# Patient Record
Sex: Female | Born: 1990 | Race: Black or African American | Hispanic: No | Marital: Single
Health system: Southern US, Community
[De-identification: ages and names within clinical notes are randomized; demographics above are authoritative.]

---

## 2011-06-11 ENCOUNTER — Emergency Department: Payer: Self-pay | Admitting: Emergency Medicine

## 2013-07-22 ENCOUNTER — Emergency Department: Payer: Self-pay | Admitting: Emergency Medicine

## 2013-07-22 LAB — CBC
HCT: 36.4 % (ref 35.0–47.0)
HGB: 12 g/dL (ref 12.0–16.0)
MCH: 30.3 pg (ref 26.0–34.0)
MCHC: 33 g/dL (ref 32.0–36.0)
MCV: 92 fL (ref 80–100)
Platelet: 202 10*3/uL (ref 150–440)
RBC: 3.96 10*6/uL (ref 3.80–5.20)
RDW: 13.8 % (ref 11.5–14.5)
WBC: 6.2 10*3/uL (ref 3.6–11.0)

## 2013-07-22 LAB — COMPREHENSIVE METABOLIC PANEL
ALBUMIN: 3.4 g/dL (ref 3.4–5.0)
ALK PHOS: 43 U/L — AB
ALT: 14 U/L (ref 12–78)
Anion Gap: 6 — ABNORMAL LOW (ref 7–16)
BILIRUBIN TOTAL: 0.4 mg/dL (ref 0.2–1.0)
BUN: 9 mg/dL (ref 7–18)
CALCIUM: 9.2 mg/dL (ref 8.5–10.1)
Chloride: 105 mmol/L (ref 98–107)
Co2: 24 mmol/L (ref 21–32)
Creatinine: 0.85 mg/dL (ref 0.60–1.30)
EGFR (Non-African Amer.): >60
Glucose: 84 mg/dL (ref 65–99)
Osmolality: 268 (ref 275–301)
Potassium: 3.6 mmol/L (ref 3.5–5.1)
SGOT(AST): 11 U/L — ABNORMAL LOW (ref 15–37)
Sodium: 135 mmol/L — ABNORMAL LOW (ref 136–145)
Total Protein: 7.3 g/dL (ref 6.4–8.2)

## 2013-07-22 LAB — URINALYSIS, COMPLETE
BACTERIA: NONE SEEN
BILIRUBIN, UR: NEGATIVE
BLOOD: NEGATIVE
Glucose,UR: NEGATIVE mg/dL (ref 0–75)
LEUKOCYTE ESTERASE: NEGATIVE
NITRITE: NEGATIVE
PH: 7 (ref 4.5–8.0)
Protein: 30
Specific Gravity: 1.008 (ref 1.003–1.030)
Squamous Epithelial: NONE SEEN

## 2013-07-22 LAB — HCG, QUANTITATIVE, PREGNANCY: BETA HCG, QUANT.: 42289 m[IU]/mL — AB

## 2013-07-29 ENCOUNTER — Ambulatory Visit: Payer: Self-pay | Admitting: Family Medicine

## 2013-10-24 ENCOUNTER — Ambulatory Visit: Payer: Self-pay | Admitting: Family Medicine

## 2013-11-20 ENCOUNTER — Emergency Department: Payer: Self-pay | Admitting: Emergency Medicine

## 2013-11-20 LAB — BASIC METABOLIC PANEL
ANION GAP: 11 (ref 7–16)
BUN: 8 mg/dL (ref 7–18)
CALCIUM: 9.1 mg/dL (ref 8.5–10.1)
CO2: 20 mmol/L — AB (ref 21–32)
Chloride: 107 mmol/L (ref 98–107)
Creatinine: 0.8 mg/dL (ref 0.60–1.30)
EGFR (African American): >60
EGFR (Non-African Amer.): >60
GLUCOSE: 79 mg/dL (ref 65–99)
OSMOLALITY: 273 (ref 275–301)
Potassium: 3.8 mmol/L (ref 3.5–5.1)
Sodium: 138 mmol/L (ref 136–145)

## 2013-11-20 LAB — CBC WITH DIFFERENTIAL/PLATELET
BASOS PCT: 0.5 %
Basophil #: 0 10*3/uL (ref 0.0–0.1)
EOS ABS: 0.1 10*3/uL (ref 0.0–0.7)
Eosinophil %: 1.2 %
HCT: 35.1 % (ref 35.0–47.0)
HGB: 11.9 g/dL — AB (ref 12.0–16.0)
LYMPHS ABS: 1.3 10*3/uL (ref 1.0–3.6)
Lymphocyte %: 20 %
MCH: 31.4 pg (ref 26.0–34.0)
MCHC: 33.9 g/dL (ref 32.0–36.0)
MCV: 93 fL (ref 80–100)
Monocyte #: 0.8 x10 3/mm (ref 0.2–0.9)
Monocyte %: 13.1 %
NEUTROS ABS: 4.2 10*3/uL (ref 1.4–6.5)
Neutrophil %: 65.2 %
Platelet: 188 10*3/uL (ref 150–440)
RBC: 3.79 10*6/uL — ABNORMAL LOW (ref 3.80–5.20)
RDW: 13.7 % (ref 11.5–14.5)
WBC: 6.5 10*3/uL (ref 3.6–11.0)

## 2013-11-20 LAB — PRO B NATRIURETIC PEPTIDE: B-Type Natriuretic Peptide: 34 pg/mL (ref 0–125)

## 2013-11-20 LAB — TROPONIN I: Troponin-I: <0.02 ng/mL

## 2013-11-21 ENCOUNTER — Observation Stay: Payer: Self-pay | Admitting: Obstetrics and Gynecology

## 2013-11-21 LAB — T4, FREE: Free Thyroxine: 1.25 ng/dL (ref 0.76–1.46)

## 2013-11-21 LAB — TSH: THYROID STIMULATING HORM: 0.451 u[IU]/mL

## 2014-01-11 ENCOUNTER — Emergency Department: Payer: Self-pay | Admitting: Emergency Medicine

## 2014-01-11 LAB — BASIC METABOLIC PANEL
Anion Gap: 7 (ref 7–16)
BUN: 7 mg/dL (ref 7–18)
CREATININE: 0.78 mg/dL (ref 0.60–1.30)
Calcium, Total: 8.3 mg/dL — ABNORMAL LOW (ref 8.5–10.1)
Chloride: 106 mmol/L (ref 98–107)
Co2: 22 mmol/L (ref 21–32)
EGFR (African American): >60
GLUCOSE: 84 mg/dL (ref 65–99)
OSMOLALITY: 267 (ref 275–301)
POTASSIUM: 3.5 mmol/L (ref 3.5–5.1)
SODIUM: 135 mmol/L — AB (ref 136–145)

## 2014-01-11 LAB — CBC WITH DIFFERENTIAL/PLATELET
BASOS ABS: 0 10*3/uL (ref 0.0–0.1)
Basophil %: 0.4 %
Eosinophil #: 0.1 10*3/uL (ref 0.0–0.7)
Eosinophil %: 1.5 %
HCT: 34.3 % — AB (ref 35.0–47.0)
HGB: 11.2 g/dL — ABNORMAL LOW (ref 12.0–16.0)
LYMPHS ABS: 1.4 10*3/uL (ref 1.0–3.6)
Lymphocyte %: 23.1 %
MCH: 30.6 pg (ref 26.0–34.0)
MCHC: 32.8 g/dL (ref 32.0–36.0)
MCV: 94 fL (ref 80–100)
Monocyte #: 1 x10 3/mm — ABNORMAL HIGH (ref 0.2–0.9)
Monocyte %: 16.5 %
NEUTROS PCT: 58.5 %
Neutrophil #: 3.6 10*3/uL (ref 1.4–6.5)
Platelet: 153 10*3/uL (ref 150–440)
RBC: 3.66 10*6/uL — ABNORMAL LOW (ref 3.80–5.20)
RDW: 13.6 % (ref 11.5–14.5)
WBC: 6.2 10*3/uL (ref 3.6–11.0)

## 2014-01-12 ENCOUNTER — Encounter: Payer: Self-pay | Admitting: Maternal & Fetal Medicine

## 2014-01-17 ENCOUNTER — Ambulatory Visit: Payer: Self-pay | Admitting: Obstetrics and Gynecology

## 2014-02-20 ENCOUNTER — Emergency Department: Payer: Self-pay | Admitting: Emergency Medicine

## 2014-02-20 LAB — BASIC METABOLIC PANEL
Anion Gap: 10 (ref 7–16)
BUN: 10 mg/dL (ref 7–18)
CALCIUM: 8.5 mg/dL (ref 8.5–10.1)
CHLORIDE: 104 mmol/L (ref 98–107)
CO2: 25 mmol/L (ref 21–32)
CREATININE: 0.73 mg/dL (ref 0.60–1.30)
EGFR (African American): >60
Glucose: 77 mg/dL (ref 65–99)
Osmolality: 275 (ref 275–301)
Potassium: 3.8 mmol/L (ref 3.5–5.1)
Sodium: 139 mmol/L (ref 136–145)

## 2014-02-20 LAB — CBC
HCT: 36.1 % (ref 35.0–47.0)
HGB: 11.9 g/dL — ABNORMAL LOW (ref 12.0–16.0)
MCH: 31.2 pg (ref 26.0–34.0)
MCHC: 32.9 g/dL (ref 32.0–36.0)
MCV: 95 fL (ref 80–100)
Platelet: 146 10*3/uL — ABNORMAL LOW (ref 150–440)
RBC: 3.81 10*6/uL (ref 3.80–5.20)
RDW: 14.1 % (ref 11.5–14.5)
WBC: 8.5 10*3/uL (ref 3.6–11.0)

## 2014-02-20 LAB — PRO B NATRIURETIC PEPTIDE: B-TYPE NATIURETIC PEPTID: 29 pg/mL (ref 0–125)

## 2014-02-25 ENCOUNTER — Observation Stay: Payer: Self-pay | Admitting: Obstetrics and Gynecology

## 2014-02-26 ENCOUNTER — Inpatient Hospital Stay: Payer: Self-pay | Admitting: Certified Nurse Midwife

## 2014-02-26 LAB — CBC WITH DIFFERENTIAL/PLATELET
Basophil #: 0.1 10*3/uL (ref 0.0–0.1)
Basophil %: 0.6 %
EOS PCT: 0 %
Eosinophil #: 0 10*3/uL (ref 0.0–0.7)
HCT: 40.2 % (ref 35.0–47.0)
HGB: 13.6 g/dL (ref 12.0–16.0)
Lymphocyte #: 1.1 10*3/uL (ref 1.0–3.6)
Lymphocyte %: 6.6 %
MCH: 31.1 pg (ref 26.0–34.0)
MCHC: 33.9 g/dL (ref 32.0–36.0)
MCV: 92 fL (ref 80–100)
MONOS PCT: 7.6 %
Monocyte #: 1.2 x10 3/mm — ABNORMAL HIGH (ref 0.2–0.9)
NEUTROS ABS: 13.8 10*3/uL — AB (ref 1.4–6.5)
NEUTROS PCT: 85.2 %
Platelet: 223 10*3/uL (ref 150–440)
RBC: 4.38 10*6/uL (ref 3.80–5.20)
RDW: 14.4 % (ref 11.5–14.5)
WBC: 16.1 10*3/uL — AB (ref 3.6–11.0)

## 2014-02-26 LAB — GC/CHLAMYDIA PROBE AMP

## 2014-02-28 LAB — CBC WITH DIFFERENTIAL/PLATELET
Basophil #: 0 10*3/uL (ref 0.0–0.1)
Basophil %: 0.4 %
EOS PCT: 1.2 %
Eosinophil #: 0.1 10*3/uL (ref 0.0–0.7)
HCT: 36.2 % (ref 35.0–47.0)
HGB: 11.7 g/dL — ABNORMAL LOW (ref 12.0–16.0)
LYMPHS PCT: 33.5 %
Lymphocyte #: 2.5 10*3/uL (ref 1.0–3.6)
MCH: 30.8 pg (ref 26.0–34.0)
MCHC: 32.4 g/dL (ref 32.0–36.0)
MCV: 95 fL (ref 80–100)
MONOS PCT: 10.7 %
Monocyte #: 0.8 x10 3/mm (ref 0.2–0.9)
Neutrophil #: 4 10*3/uL (ref 1.4–6.5)
Neutrophil %: 54.2 %
Platelet: 172 10*3/uL (ref 150–440)
RBC: 3.8 10*6/uL (ref 3.80–5.20)
RDW: 14.4 % (ref 11.5–14.5)
WBC: 7.4 10*3/uL (ref 3.6–11.0)

## 2014-08-05 NOTE — Consult Note (Signed)
PATIENT NAME:  Tracey Nelson, PAREKH MR#:  [id number] DATE OF BIRTH:  Jul 21, 1990  DATE OF CONSULTATION:  11/21/2013  REFERRING PHYSICIAN:   CONSULTING PHYSICIAN:  Herschell Dimes. Renae Gloss, MD  REASON FOR CONSULTATION: Pneumonia.   HISTORY OF PRESENT ILLNESS: This is a 24 year old female who was in the ER yesterday, diagnosed with pneumonia. Was given a prescription for a Z-Pak, but did not pick up the antibiotic. Went to the gynecologist today and was referred in for hospitalization secondary to pneumonia. The patient has been having hot and cold chills, sore throat, equilibrium has been off. She has been coughing nonstop for 8 days, more coughing and production of yellow phlegm in the morning. She has been short of breath. She actually vomits when she has coughing fits. She is currently hemodynamically stable and afebrile, and hospitalist services were contacted for consultation.   PAST MEDICAL HISTORY: Asthma, thyroid issue, anxiety and depression.   PAST SURGICAL HISTORY: Ganglion cyst.  ALLERGIES: No known drug allergies.   MEDICATIONS: The patient stopped her prenatal vitamins and stopped her Zoloft, now not taking any medications at home.   SOCIAL HISTORY: Quit smoking with the pregnancy. No alcohol. No drug use.   FAMILY HISTORY: Mother has ovarian cancer, asthma and heart issues. Father, unknown medical history.   REVIEW OF SYSTEMS:  CONSTITUTIONAL: Positive for hot and cold feeling, chills. Positive for weight loss with early pregnancy. Positive for fatigue.  EYES: No blurry vision.  ENT: Positive for sore throat.  CARDIOVASCULAR: Positive for chest pain, rib pain, and back pain.  RESPIRATORY: Positive for shortness of breath, coughing nonstop for 8 days in the a.m., brings up yellow phlegm.  GASTROINTESTINAL: Positive for vomiting. Positive for diarrhea, slight blood. No abdominal pain.  GENITOURINARY: Positive for dark urine and blood in the urine.  MUSCULOSKELETAL: Positive for neck  pain, back pain and rib pain.  NEUROLOGIC: Equilibrium feels a little bit off. Has been having some falls at home.  PSYCHIATRIC: Positive for anxiety and depression.  ENDOCRINE: Thyroid issue in the past. States that she was going to have a biopsy of the thyroid, but then lost insurance.  HEMATOLOGIC AND LYMPHATIC: No anemia.   PHYSICAL EXAMINATION:  VITAL SIGNS: Temperature 97.8, pulse 77, respirations 18, blood pressure 104/64, pulse oximetry 96% on room air.  GENERAL: No respiratory distress.  EYES: Conjunctivae and lids normal. Pupils equal, round, and reactive to light. Extraocular muscles intact. No nystagmus.  ENT: Tympanic membrane on the right bulging and erythematous. Tympanic membrane on the left bulging. No erythema. Nasal mucosa, no erythema. Throat, slight erythema. No exudate seen. Lips and gums, no lesions.  NECK: No JVD. No bruits. No lymphadenopathy, no thyromegaly palpated.  LUNGS: Clear to auscultation. No use of accessory muscles to breathe. No rhonchi, rales, or wheeze heard.  CARDIOVASCULAR: S1, S2 normal. No gallops, rubs, or murmurs heard. Carotid upstroke 2+ bilaterally. No bruits.  EXTREMITIES: Dorsalis pedis pulses 1+ bilaterally, 2+ edema bilateral lower extremities.  ABDOMEN: Soft, nontender. No organomegaly or splenomegaly. Normoactive bowel sounds. No masses felt.  LYMPHATIC: No lymph nodes in the neck.  MUSCULOSKELETAL: No clubbing, 2+ edema, no cyanosis.  SKIN: No ulcers or lesions seen anteriorly.  NEUROLOGIC: Cranial nerves II through XII grossly intact. Deep tendon reflexes 1+ bilateral lower extremities.  PSYCHIATRIC: The patient is oriented to person, place, and time.   LABORATORY AND RADIOLOGICAL DATA: Troponin negative. White blood cell count 6.5, hemoglobin and hematocrit 11.9 and 35.1, platelet count 188,000. BNP 34, glucose 79, BUN  8, creatinine 0.8, sodium 138, potassium 3.8, chloride 107, CO2 of 20, calcium 9.1.   Chest x-ray showed lower lobe  airspace disease, likely representing pneumonia in the right lower lung.   ASSESSMENT AND PLAN:  1. Pneumonia in a pregnant patient. Rocephin and Zithromax ordered IV while here. Can switch over to Ceftin 500 mg p.o. b.i.d. for a total of 10 days. I wrote the prescription for 9 days, assuming she will get the Rocephin here today and Zithromax can be switched over to 250 mg p.o. daily for a total of 5 days upon discharge. I ordered for a care manager to consult to help out with medications. The patient does have the Zithromax prescription already at the pharmacy. With the patient's normal vital signs, she should be able to go home soon.  2. Obesity, no weight recorded yet.  3. History of depression, anxiety. Gynecologist wrote for Zoloft. The patient stopped taking secondary to nausea.  4. History of asthma. No signs of asthma exacerbation at this point.  5. History of thyroid problems in the past. We will check a TSH and free T4.   TIME SPENT ON CONSULTATION: 50 minutes.    ____________________________ Herschell Dimesichard J. Renae GlossWieting, MD rjw:jr D: 11/21/2013 13:37:05 ET T: 11/21/2013 14:37:41 ET JOB#: [id number]  cc: Herschell Dimesichard J. Renae GlossWieting, MD, <Dictator> Salley ScarletICHARD J Abdullah Rizzi MD ELECTRONICALLY SIGNED 11/22/2013 19:57
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
## 2015-02-03 IMAGING — CR DG CHEST 1V PORT
1 series · 1 of 1 positions shown · non-contrast
Comparison: None.

CLINICAL DATA: 23-year-old female with shortness of breath.

EXAM:
PORTABLE CHEST - 1 VIEW

[ap]
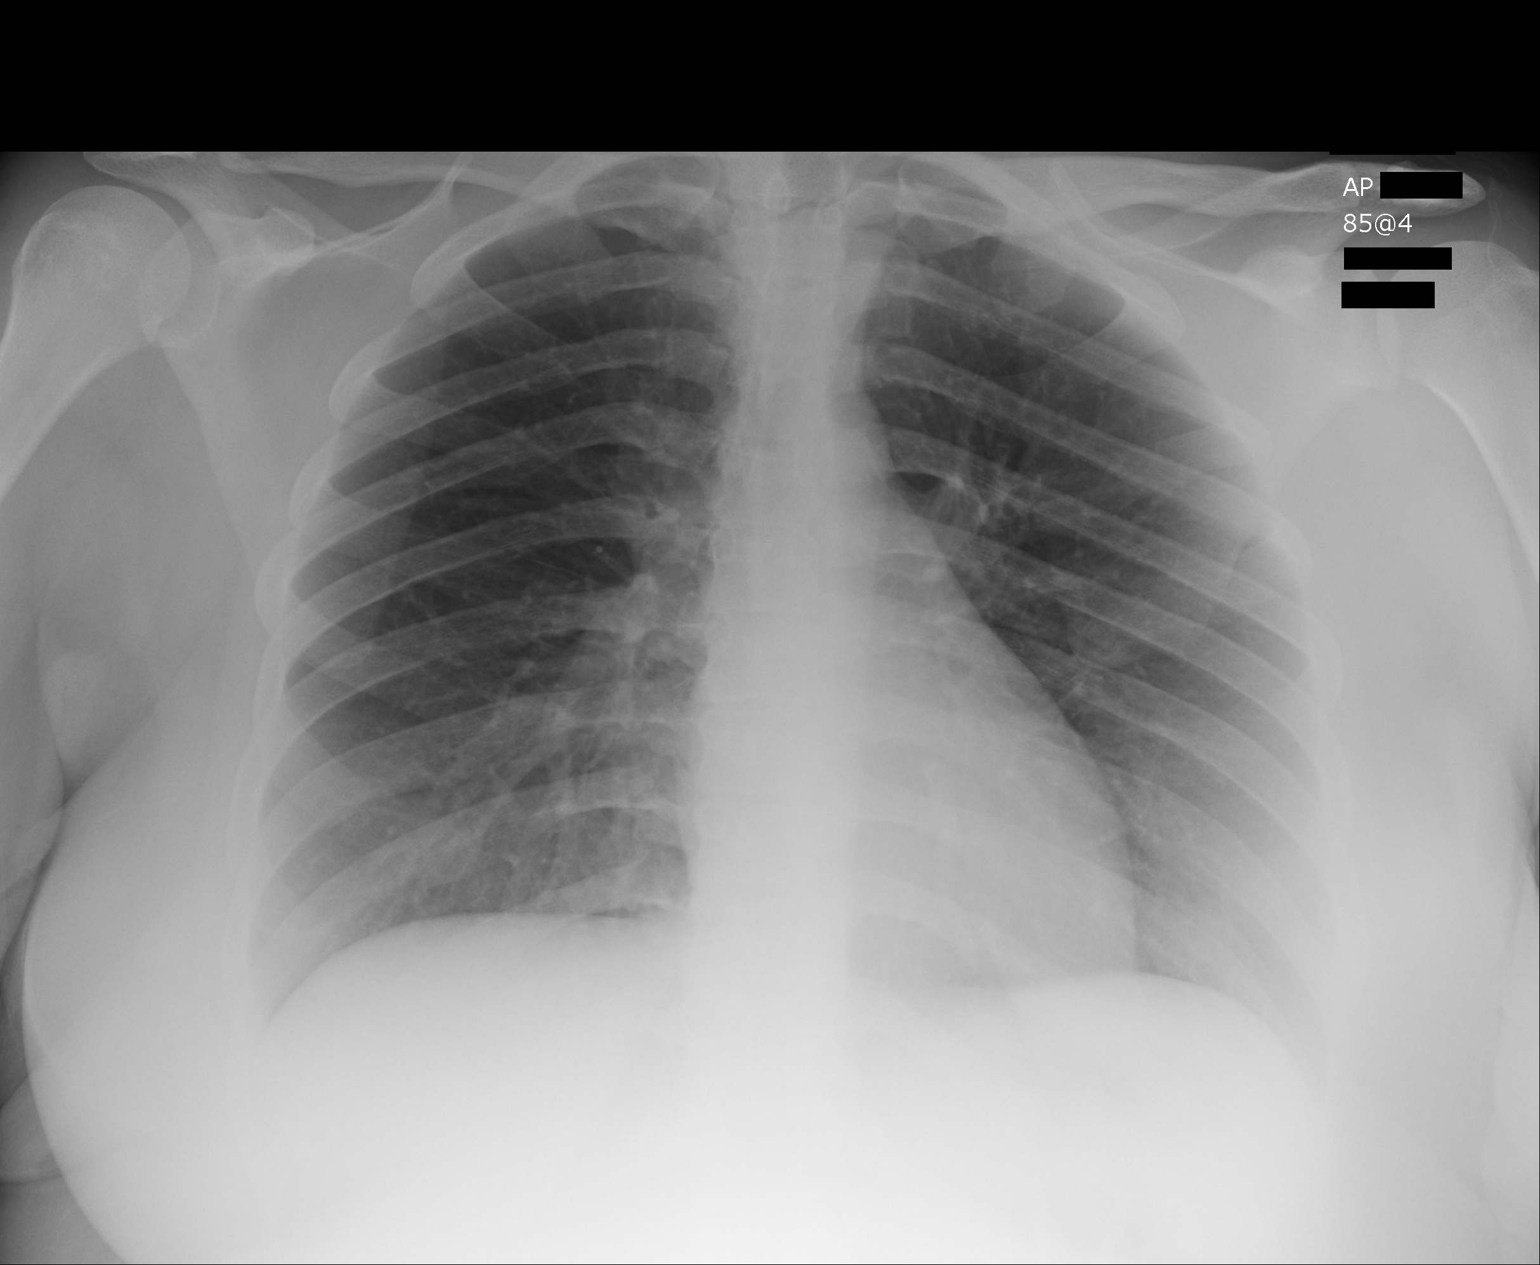

[1 of 1 positions shown; findings below may reference images not displayed]

FINDINGS: The cardiomediastinal silhouette is unremarkable.

Mild increased density overlying the medial right lower lung may
represent atelectasis or airspace disease.

There is no evidence of pulmonary edema, suspicious pulmonary
nodule/mass, pleural effusion, or pneumothorax. No acute bony
abnormalities are identified.
IMPRESSION: Question medial right lower lung opacity which may represent
atelectasis or airspace disease/pneumonia. Consider lateral chest
radiograph or radiographic followup.

## 2015-02-03 IMAGING — CR DG CHEST 1V
1 series · 1 of 1 positions shown · non-contrast
Comparison: 11/20/2013 frontal chest radiograph

CLINICAL DATA: 23-year-old female with shortness of breath.

EXAM:
CHEST - 1 VIEW

[lat]
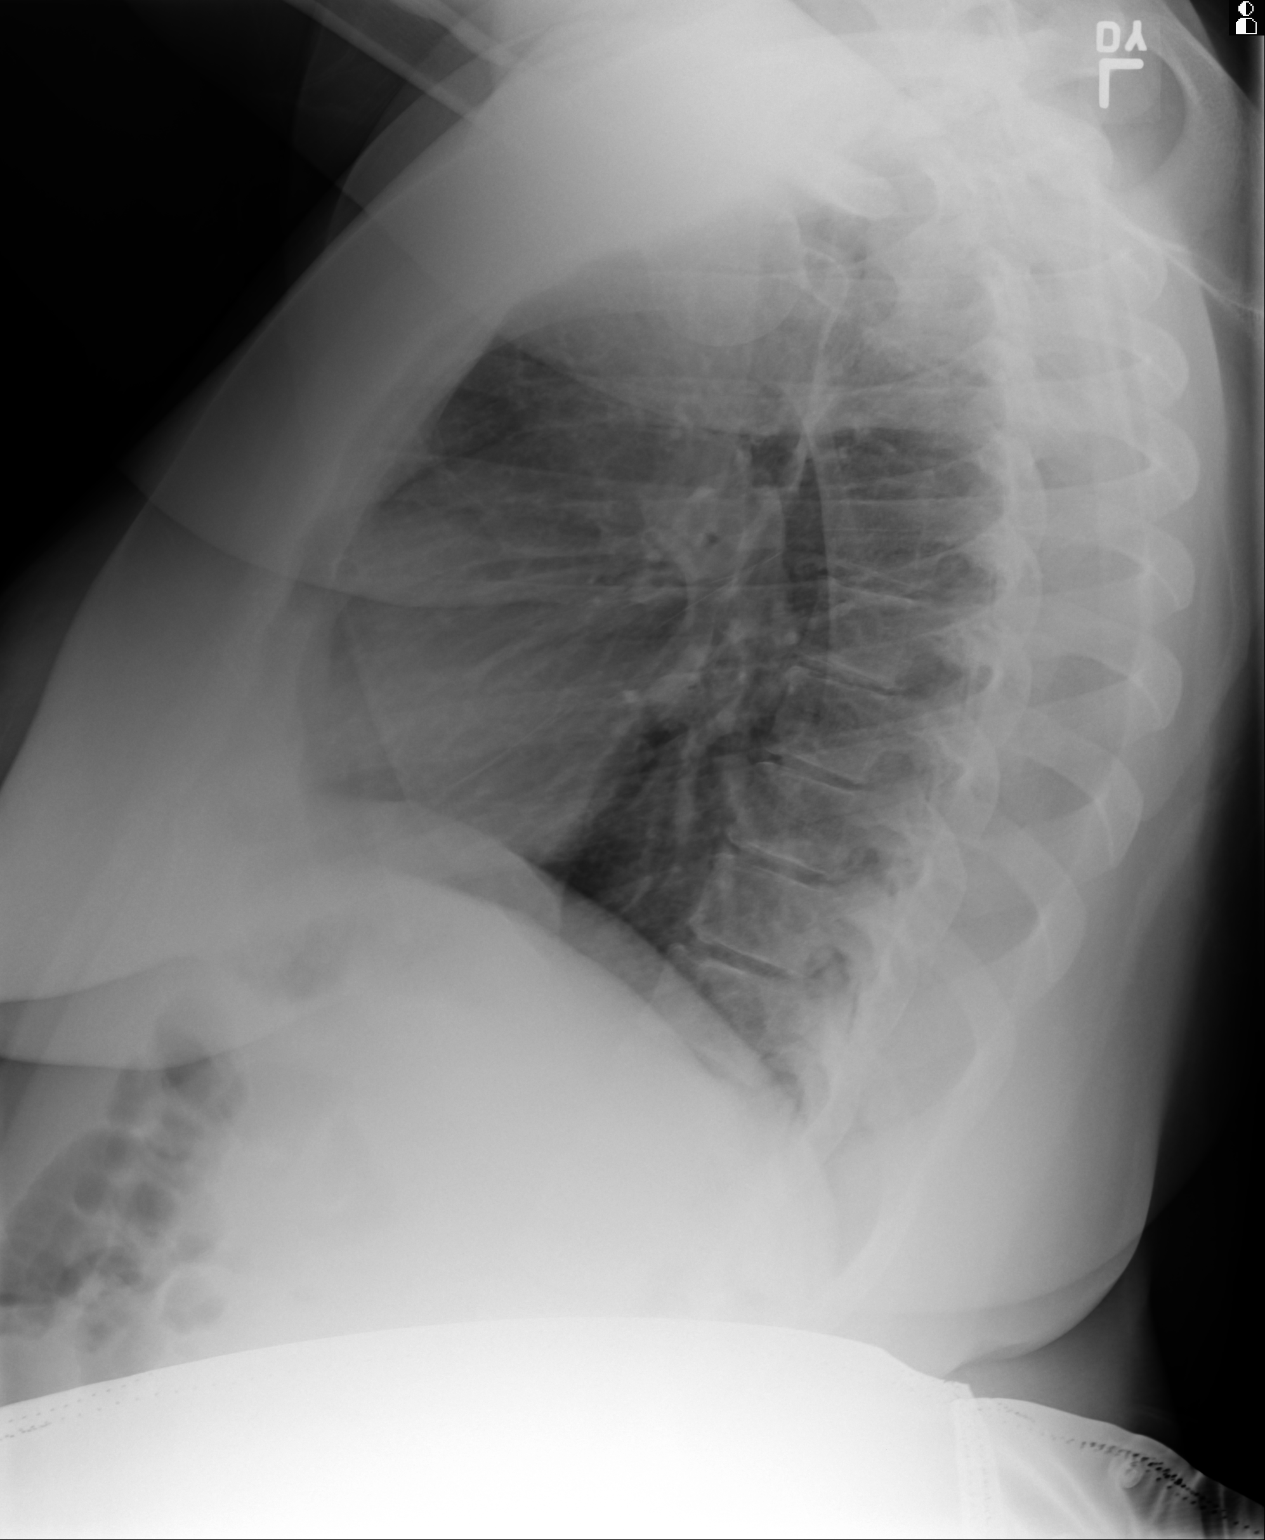

[1 of 1 positions shown; findings below may reference images not displayed]

FINDINGS: A lateral view of the chest demonstrates airspace opacity within the
posterior lower lobe compatible with pneumonia. There is no evidence
of pleural effusion.

No other significant abnormalities identified.
IMPRESSION: Lower lobe airspace disease likely representing pneumonia as seen in
the right lower lung on the frontal view. Radiographic followup to
resolution is recommended.

## 2015-03-27 IMAGING — CR DG CHEST 2V
1 series · 2 of 2 positions shown · non-contrast
Comparison: November 20, 2013.

CLINICAL DATA: Shortness of breath.

EXAM:
CHEST  2 VIEW

[Series 1: dxr chest pa (or ap) and lateral · 0.14mm/px · 2 of 2 slices shown]
[im 1/2]
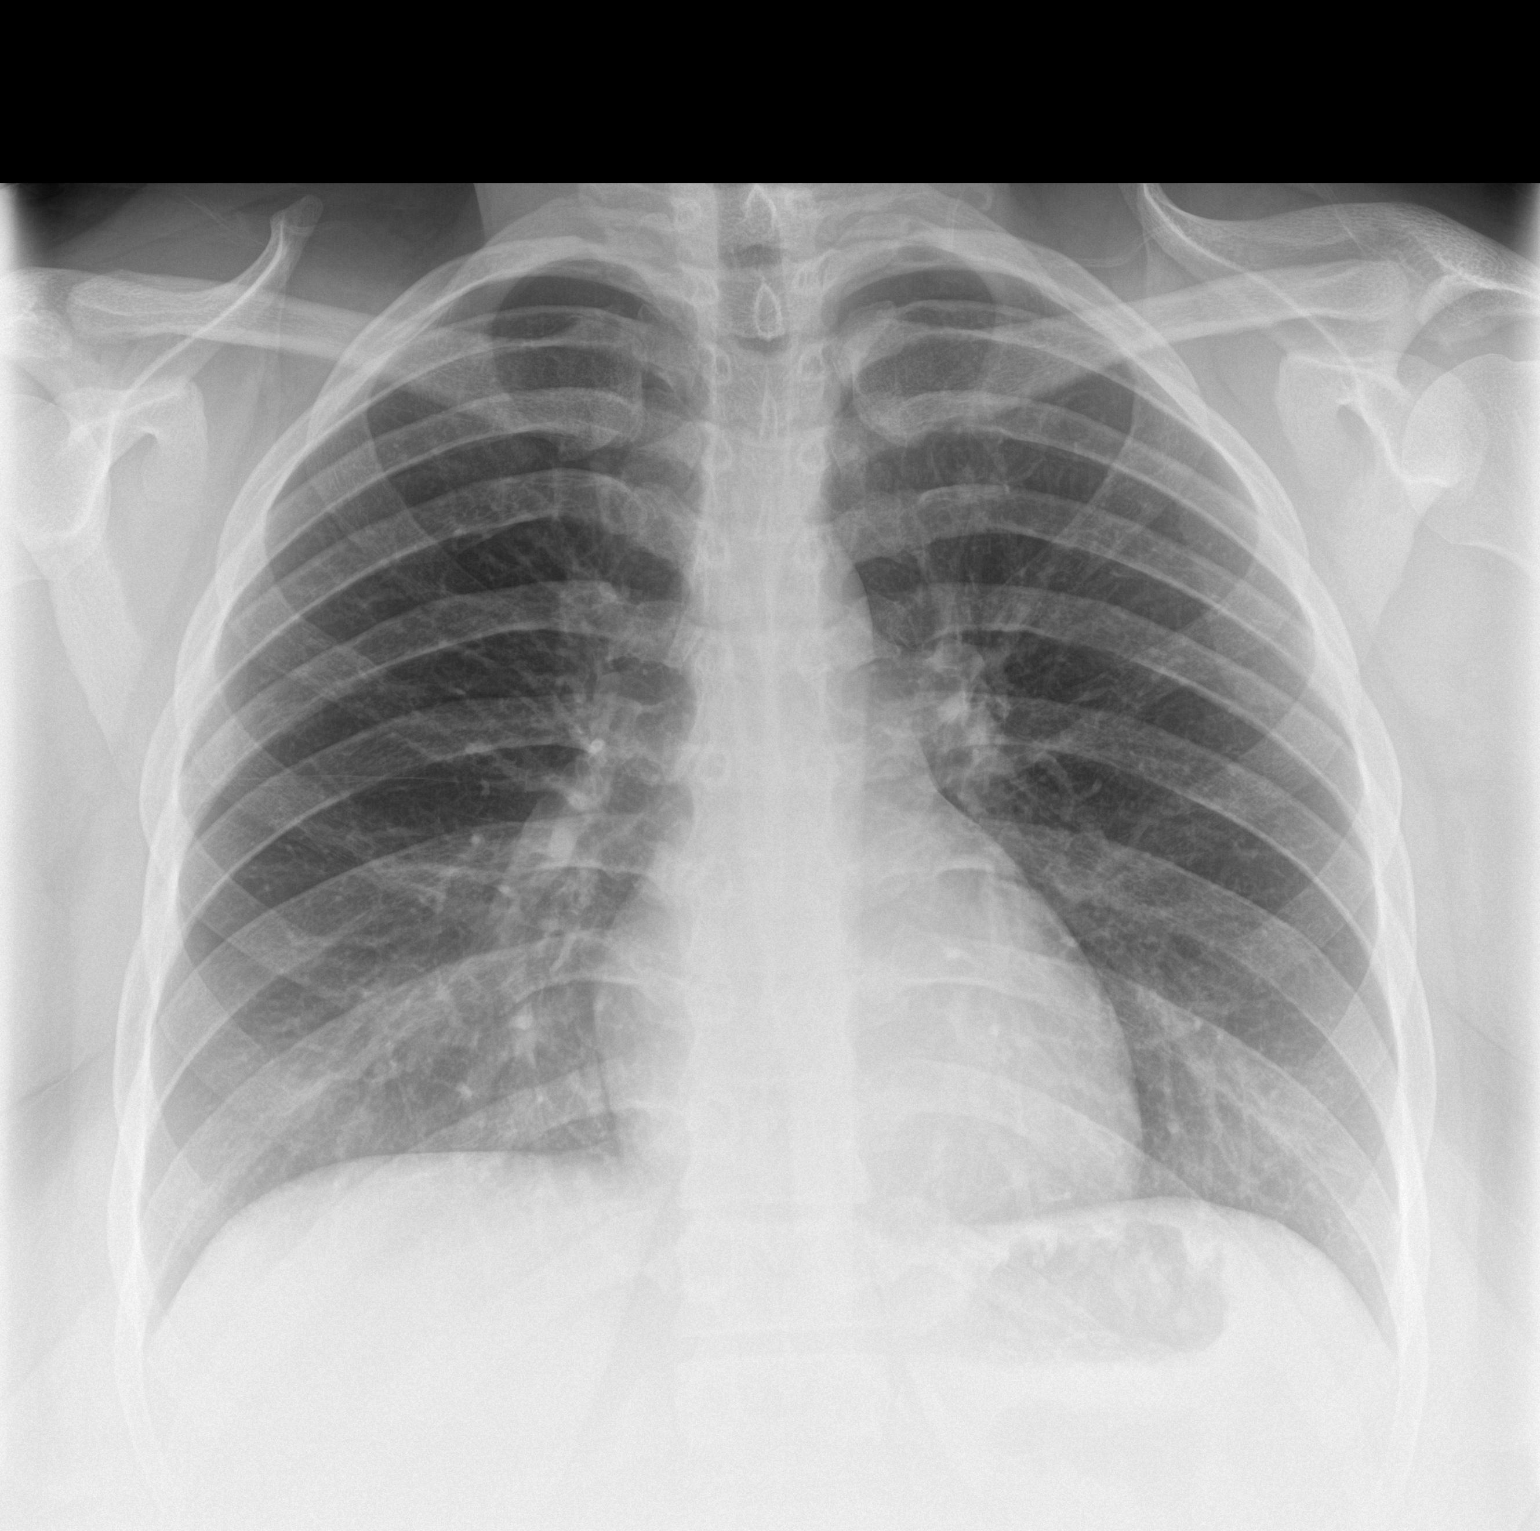
[im 2/2]
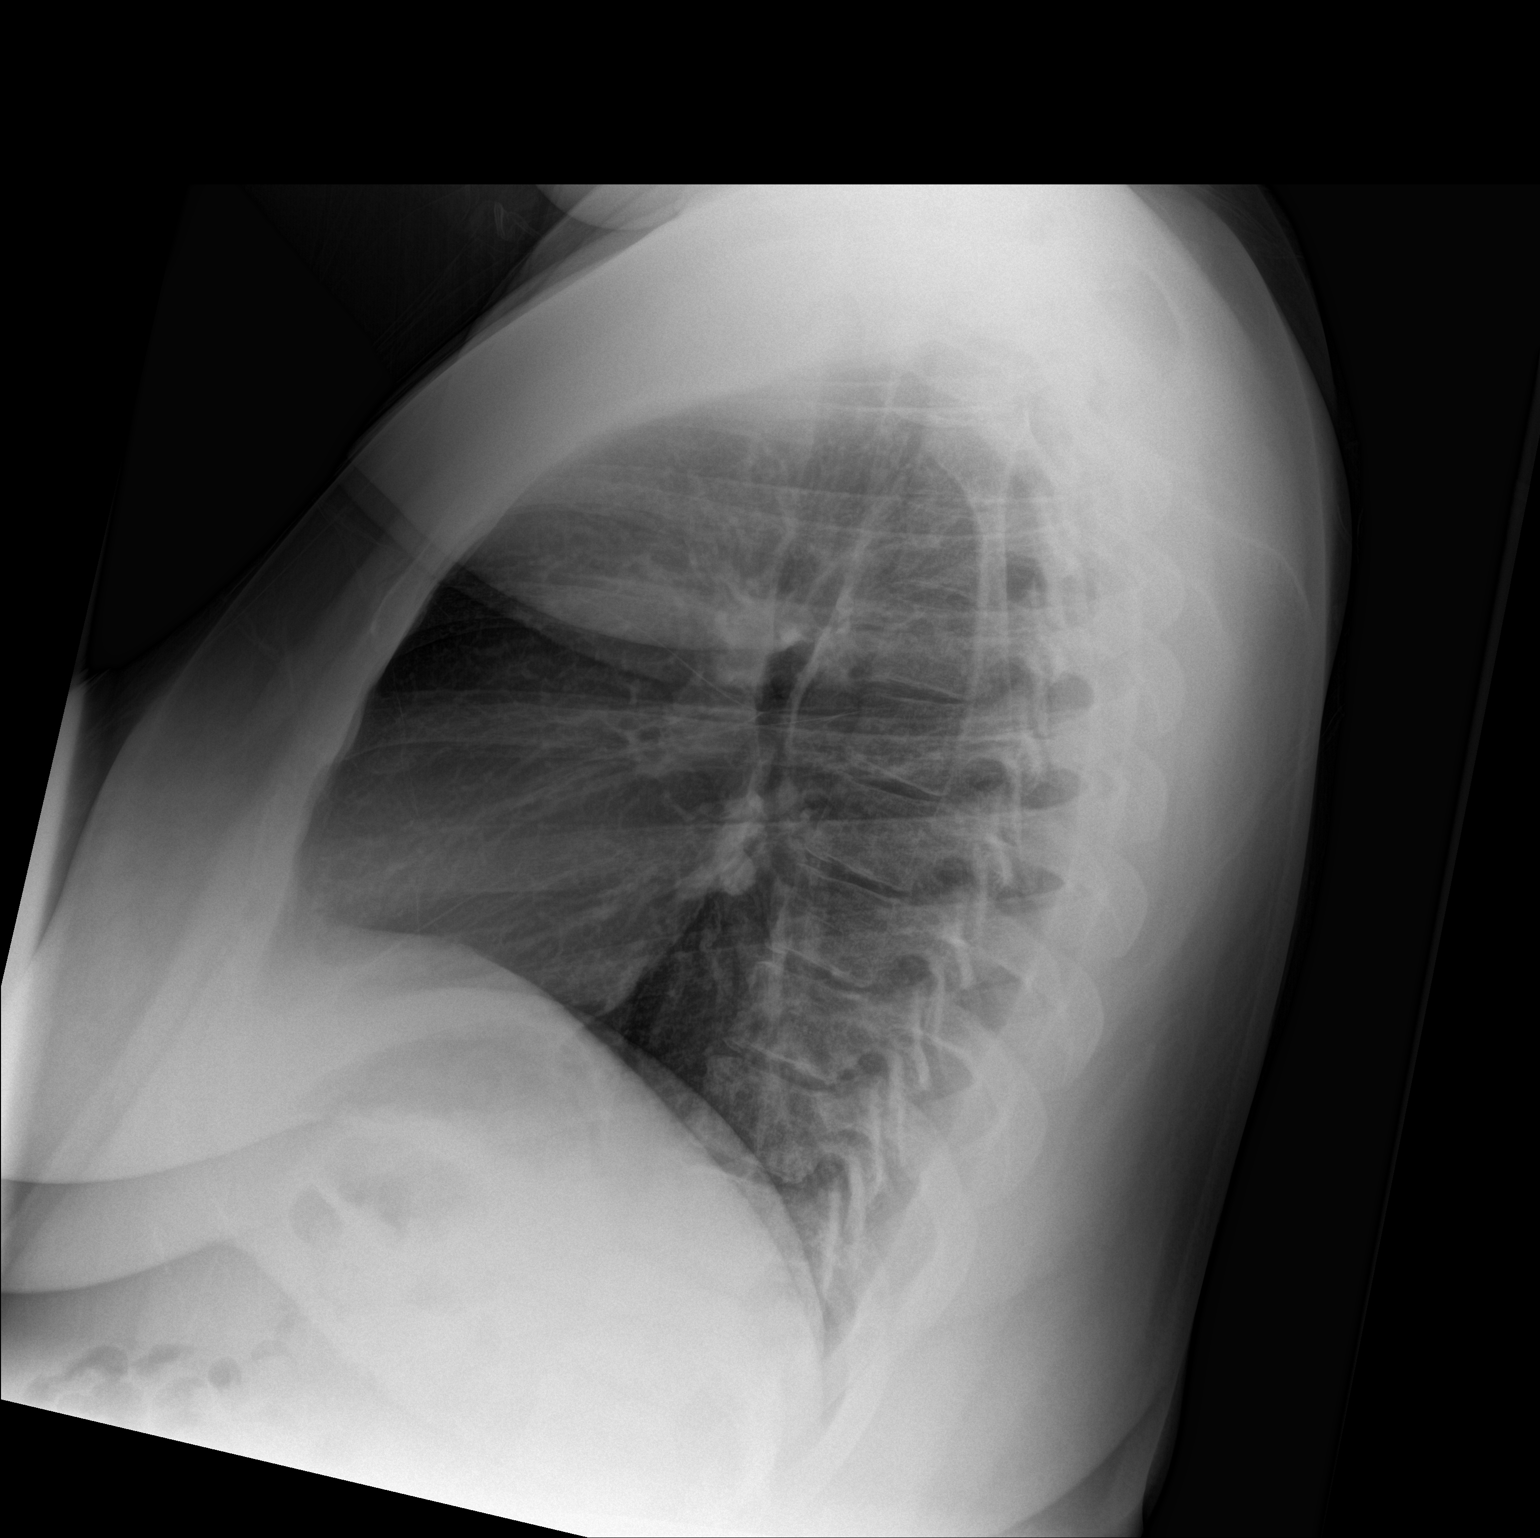

[2 of 2 positions shown; findings below may reference images not displayed]

FINDINGS: The heart size and mediastinal contours are within normal limits.
Both lungs are clear. No pneumothorax or pleural effusion is noted.
The visualized skeletal structures are unremarkable.
IMPRESSION: No acute cardiopulmonary abnormality seen.
